# Patient Record
Sex: Male | Born: 1995 | Race: White | Hispanic: No | Marital: Single
Health system: Southern US, Community
[De-identification: ages and names within clinical notes are randomized; demographics above are authoritative.]

---

## 2020-07-21 ENCOUNTER — Emergency Department (HOSPITAL_COMMUNITY): Payer: BC Managed Care – PPO

## 2020-07-21 ENCOUNTER — Other Ambulatory Visit: Payer: Self-pay

## 2020-07-21 ENCOUNTER — Emergency Department (HOSPITAL_COMMUNITY)
Admission: EM | Admit: 2020-07-21 | Discharge: 2020-07-22 | Disposition: A | Discharge status: Home / Self Care | Payer: BC Managed Care – PPO | Attending: Emergency Medicine | Admitting: Emergency Medicine

## 2020-07-21 DIAGNOSIS — S82114A Nondisplaced fracture of right tibial spine, initial encounter for closed fracture: Secondary | ICD-10-CM

## 2020-07-21 DIAGNOSIS — T07XXXA Unspecified multiple injuries, initial encounter: Secondary | ICD-10-CM | POA: Diagnosis not present

## 2020-07-21 DIAGNOSIS — S99191A Other physeal fracture of right metatarsal, initial encounter for closed fracture: Secondary | ICD-10-CM | POA: Diagnosis not present

## 2020-07-21 DIAGNOSIS — S62514A Nondisplaced fracture of proximal phalanx of right thumb, initial encounter for closed fracture: Secondary | ICD-10-CM

## 2020-07-21 DIAGNOSIS — Y9355 Activity, bike riding: Secondary | ICD-10-CM | POA: Insufficient documentation

## 2020-07-21 DIAGNOSIS — R519 Headache, unspecified: Secondary | ICD-10-CM | POA: Insufficient documentation

## 2020-07-21 DIAGNOSIS — M25511 Pain in right shoulder: Secondary | ICD-10-CM | POA: Diagnosis present

## 2020-07-21 DIAGNOSIS — S61011A Laceration without foreign body of right thumb without damage to nail, initial encounter: Secondary | ICD-10-CM

## 2020-07-21 DIAGNOSIS — S060X9A Concussion with loss of consciousness of unspecified duration, initial encounter: Secondary | ICD-10-CM

## 2020-07-21 DIAGNOSIS — R21 Rash and other nonspecific skin eruption: Secondary | ICD-10-CM | POA: Insufficient documentation

## 2020-07-21 DIAGNOSIS — R42 Dizziness and giddiness: Secondary | ICD-10-CM | POA: Diagnosis not present

## 2020-07-21 LAB — CBC
HCT: 39.8 % (ref 39.0–52.0)
Hemoglobin: 13.8 g/dL (ref 13.0–17.0)
MCH: 30.7 pg (ref 26.0–34.0)
MCHC: 34.7 g/dL (ref 30.0–36.0)
MCV: 88.6 fL (ref 80.0–100.0)
Platelets: 335 10*3/uL (ref 150–400)
RBC: 4.49 MIL/uL (ref 4.22–5.81)
RDW: 12.5 % (ref 11.5–15.5)
WBC: 9 10*3/uL (ref 4.0–10.5)
nRBC: 0 % (ref 0.0–0.2)

## 2020-07-21 LAB — COMPREHENSIVE METABOLIC PANEL
ALT: 19 U/L (ref 0–44)
AST: 30 U/L (ref 15–41)
Albumin: 4.3 g/dL (ref 3.5–5.0)
Alkaline Phosphatase: 67 U/L (ref 38–126)
Anion gap: 9 (ref 5–15)
BUN: 16 mg/dL (ref 6–20)
CO2: 24 mmol/L (ref 22–32)
Calcium: 9 mg/dL (ref 8.9–10.3)
Chloride: 103 mmol/L (ref 98–111)
Creatinine, Ser: 1.24 mg/dL (ref 0.61–1.24)
GFR calc Af Amer: >60 mL/min (ref >60)
GFR calc non Af Amer: >60 mL/min (ref >60)
Glucose, Bld: 148 mg/dL — ABNORMAL HIGH (ref 70–99)
Potassium: 3.8 mmol/L (ref 3.5–5.1)
Sodium: 136 mmol/L (ref 135–145)
Total Bilirubin: 0.8 mg/dL (ref 0.3–1.2)
Total Protein: 6.1 g/dL — ABNORMAL LOW (ref 6.5–8.1)

## 2020-07-21 LAB — I-STAT CHEM 8, ED
BUN: 20 mg/dL (ref 6–20)
Calcium, Ion: 1.16 mmol/L (ref 1.15–1.40)
Chloride: 103 mmol/L (ref 98–111)
Creatinine, Ser: 1.1 mg/dL (ref 0.61–1.24)
Glucose, Bld: 141 mg/dL — ABNORMAL HIGH (ref 70–99)
HCT: 39 % (ref 39.0–52.0)
Hemoglobin: 13.3 g/dL (ref 13.0–17.0)
Potassium: 4 mmol/L (ref 3.5–5.1)
Sodium: 140 mmol/L (ref 135–145)
TCO2: 26 mmol/L (ref 22–32)

## 2020-07-21 LAB — ETHANOL: Alcohol, Ethyl (B): <10 mg/dL (ref <10)

## 2020-07-21 LAB — SAMPLE TO BLOOD BANK

## 2020-07-21 LAB — PROTIME-INR
INR: 1.1 (ref 0.8–1.2)
Prothrombin Time: 13.4 seconds (ref 11.4–15.2)

## 2020-07-21 LAB — LACTIC ACID, PLASMA: Lactic Acid, Venous: 2.1 mmol/L (ref 0.5–1.9)

## 2020-07-21 MED ORDER — LIDOCAINE HCL (PF) 1 % IJ SOLN
5.0000 mL | Freq: Once | INTRAMUSCULAR | Status: AC
  Administered 2020-07-21: 5 mL
  Filled 2020-07-21: qty 5

## 2020-07-21 MED ORDER — OXYCODONE-ACETAMINOPHEN 5-325 MG PO TABS
1.0000 | ORAL_TABLET | Freq: Once | ORAL | Status: AC
  Administered 2020-07-21: 1 via ORAL
  Filled 2020-07-21: qty 1

## 2020-07-21 MED ORDER — CEPHALEXIN 250 MG PO CAPS
500.0000 mg | ORAL_CAPSULE | Freq: Once | ORAL | Status: AC
  Administered 2020-07-21: 500 mg via ORAL
  Filled 2020-07-21: qty 2

## 2020-07-21 MED ORDER — MORPHINE SULFATE (PF) 4 MG/ML IV SOLN
4.0000 mg | Freq: Once | INTRAVENOUS | Status: AC
  Administered 2020-07-21: 4 mg via INTRAVENOUS
  Filled 2020-07-21: qty 1

## 2020-07-21 MED ORDER — SODIUM CHLORIDE 0.9 % IV BOLUS
1000.0000 mL | Freq: Once | INTRAVENOUS | Status: AC
  Administered 2020-07-21: 1000 mL via INTRAVENOUS

## 2020-07-21 MED ORDER — IOHEXOL 300 MG/ML  SOLN
100.0000 mL | Freq: Once | INTRAMUSCULAR | Status: AC | PRN
  Administered 2020-07-21: 100 mL via INTRAVENOUS
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
## 2020-07-21 NOTE — Progress Notes (Signed)
Orthopedic Tech Progress Note Patient Details:  Howard Hughes 01-04-1996 [id number]  Ortho Devices Type of Ortho Device: Crutches, CAM walker, Thumb velcro splint, Knee Immobilizer Ortho Device/Splint Location: RUE, RLE Ortho Device/Splint Interventions: Application, Adjustment   Post Interventions Patient Tolerated: Well Instructions Provided: Adjustment of device, Poper ambulation with device   Howard Hughes 07/21/2020, 11:54 PM
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
## 2020-07-22 ENCOUNTER — Emergency Department (HOSPITAL_COMMUNITY): Payer: BC Managed Care – PPO

## 2020-07-22 MED ORDER — HYDROMORPHONE HCL 1 MG/ML IJ SOLN: 1.0000 mg | Freq: Once | INTRAMUSCULAR | Status: DC

## 2020-07-22 MED ORDER — OXYCODONE-ACETAMINOPHEN 5-325 MG PO TABS: 1.0000 | ORAL_TABLET | ORAL | 0 refills | Status: AC | PRN

## 2020-07-22 MED ORDER — HYDROMORPHONE HCL 1 MG/ML IJ SOLN
1.0000 mg | Freq: Once | INTRAMUSCULAR | Status: AC
  Administered 2020-07-22: 1 mg via INTRAMUSCULAR
  Filled 2020-07-22: qty 1

## 2020-07-22 MED ORDER — CEPHALEXIN 500 MG PO CAPS: 500.0000 mg | ORAL_CAPSULE | Freq: Four times a day (QID) | ORAL | 0 refills | Status: AC

## 2020-07-22 NOTE — ED Notes (Signed)
Informed Dr. Manus Gunning pt no longer has IV for IV medication. Pt will be discharged. Requested IM/PO pain control

## 2020-07-22 NOTE — ED Notes (Signed)
Discharge instructions discussed with pt. pt verbalized understanding with no questions at this time. Mother at bedside. Pt wheelchair to car.
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
## 2021-07-02 IMAGING — CR DG FOOT COMPLETE 3+V*R*
3 series · 3 of 3 positions shown · non-contrast
Comparison: X-ray right ankle 07/21/2020

CLINICAL DATA: Fracture of the base of the fifth metatarsal, needs
full view

EXAM:
RIGHT FOOT COMPLETE - 3+ VIEW

[foot ap]
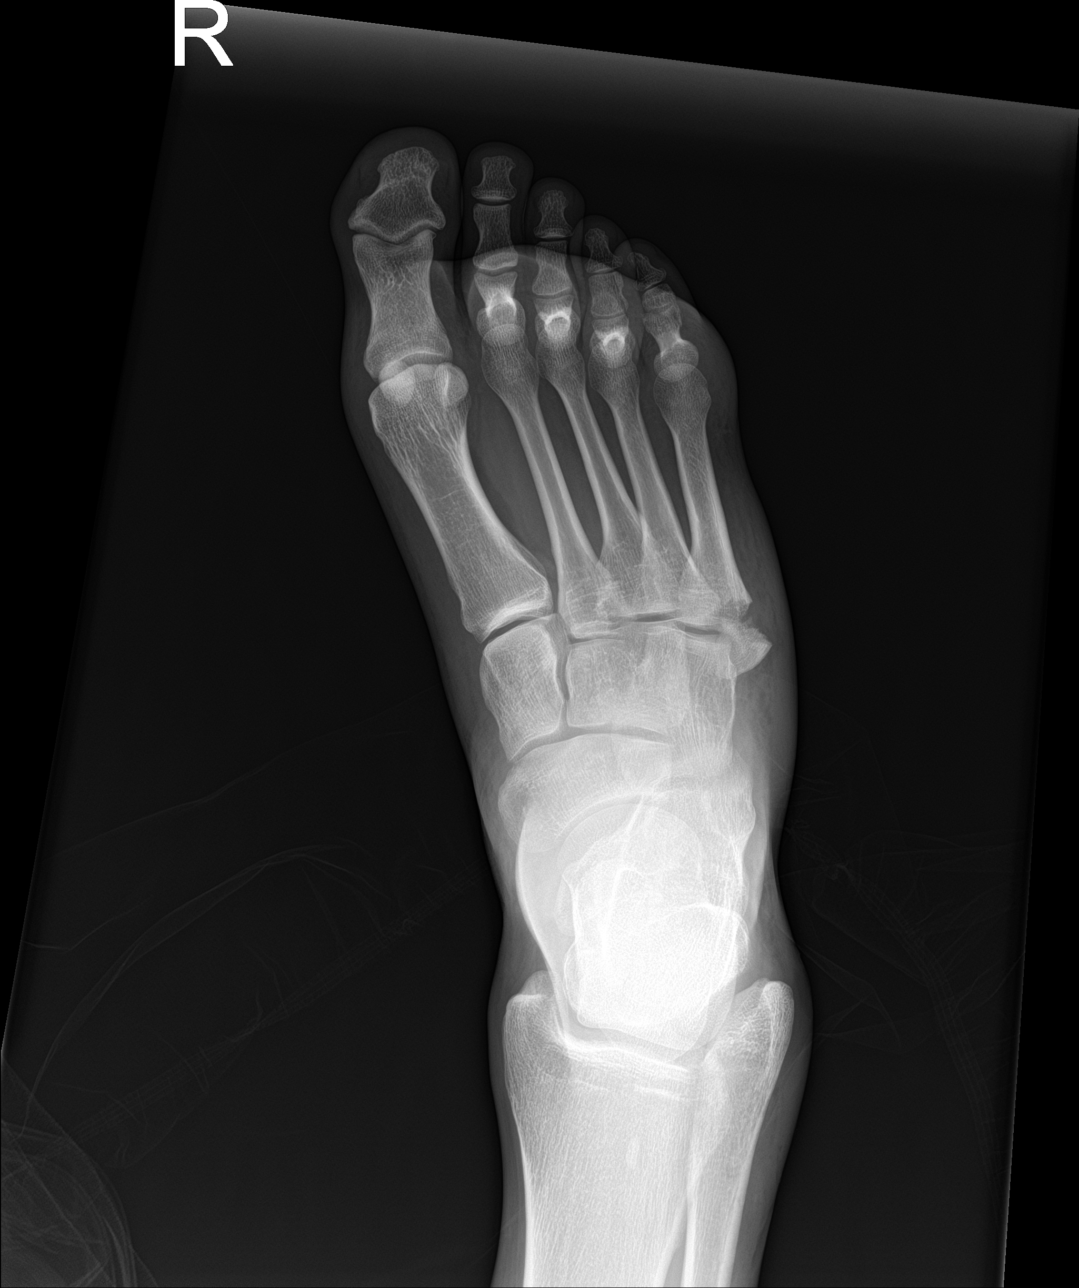

[foot obl]
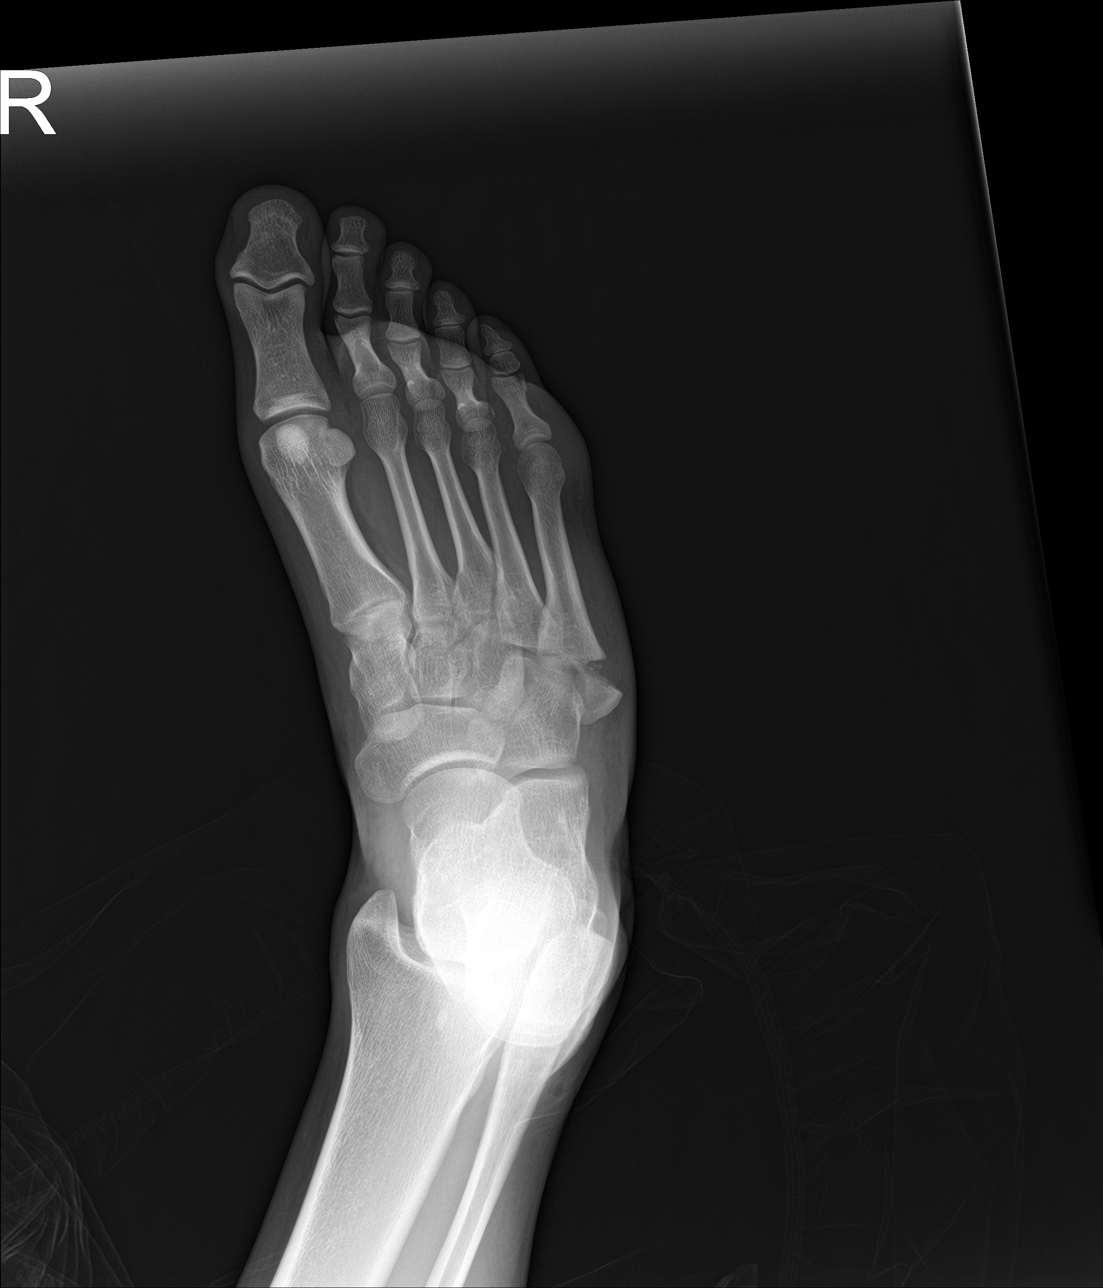

[foot lat]
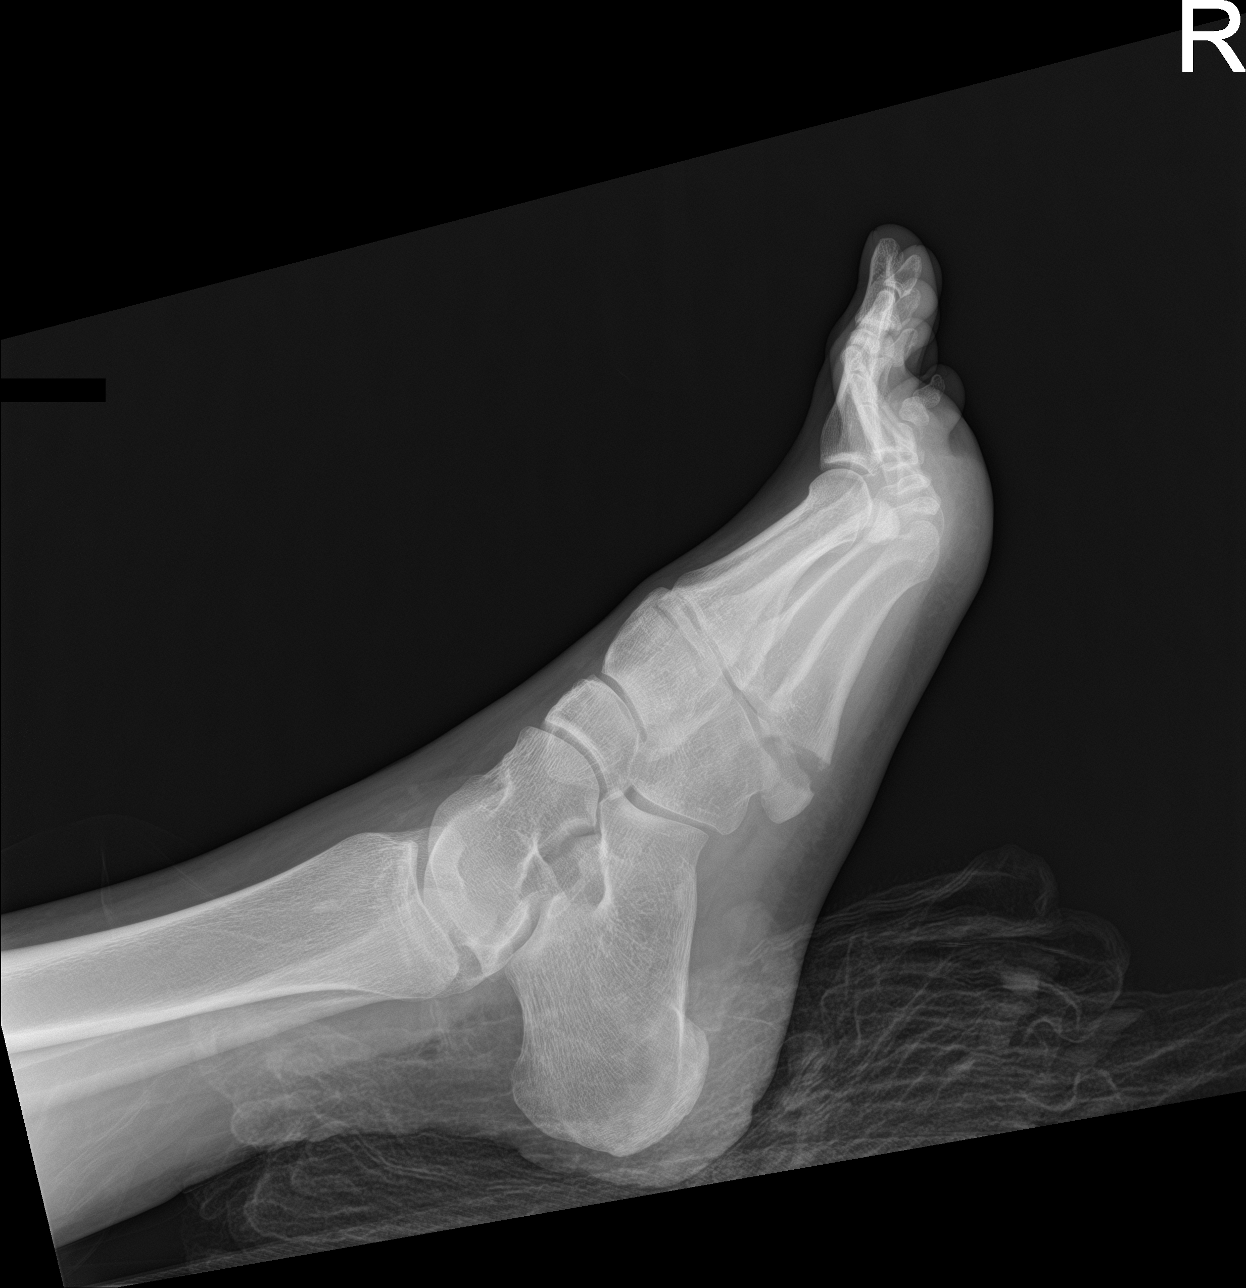

[3 of 3 positions shown; findings below may reference images not displayed]

FINDINGS: Posterolaterally displaced and laterally angulated fracture of the
base of the fifth metatarsal. Fracture located approximately 1.5 cm
away from the base. Couple of punctate fracture fragments are noted
anterior to the larger fragment. Associated subcutaneous soft tissue
edema of the lateral foot.

No other acute fracture or dislocation of the bones of the right
foot
IMPRESSION: 1. Comminuted and posterolaterally displaced Jones fracture of the
fifth metatarsal.
2. No other acute fracture or dislocation of the bones of the right
foot.
